# Patient Record
Sex: Female | Born: 1935 | Race: White | Hispanic: No | Smoking: Former smoker
Health system: Southern US, Community
[De-identification: ages and names within clinical notes are randomized; demographics above are authoritative.]

## PROBLEM LIST (undated history)

## (undated) DIAGNOSIS — E119 Type 2 diabetes mellitus without complications: Secondary | ICD-10-CM

## (undated) DIAGNOSIS — E785 Hyperlipidemia, unspecified: Secondary | ICD-10-CM

## (undated) DIAGNOSIS — D649 Anemia, unspecified: Secondary | ICD-10-CM

## (undated) DIAGNOSIS — I10 Essential (primary) hypertension: Secondary | ICD-10-CM

## (undated) HISTORY — DX: Hyperlipidemia, unspecified: E78.5

## (undated) HISTORY — DX: Anemia, unspecified: D64.9

## (undated) HISTORY — DX: Type 2 diabetes mellitus without complications: E11.9

## (undated) HISTORY — DX: Essential (primary) hypertension: I10

---

## 2000-03-18 ENCOUNTER — Inpatient Hospital Stay
Admission: EM | Admit: 2000-03-18 | Disposition: A | Discharge status: Home / Self Care | Payer: Self-pay | Source: Emergency Department | Admitting: Internal Medicine

## 2000-04-22 ENCOUNTER — Inpatient Hospital Stay
Admission: RE | Admit: 2000-04-22 | Disposition: A | Discharge status: Home / Self Care | Payer: Self-pay | Source: Ambulatory Visit | Admitting: Obstetrics & Gynecology

## 2008-09-29 ENCOUNTER — Ambulatory Visit
Admit: 2008-09-29 | Disposition: A | Discharge status: Home / Self Care | Payer: Self-pay | Source: Ambulatory Visit | Admitting: Internal Medicine

## 2008-10-19 ENCOUNTER — Ambulatory Visit
Admit: 2008-10-19 | Disposition: A | Discharge status: Home / Self Care | Payer: Self-pay | Source: Ambulatory Visit | Admitting: Surgery

## 2008-10-19 LAB — CBC AND DIFFERENTIAL
Basophils Absolute: 0 /mm3 (ref 0.0–0.2)
Basophils: 0 % (ref 0–2)
Eosinophils Absolute: 0.1 /mm3 (ref 0.0–0.7)
Eosinophils: 1 % (ref 0–5)
Granulocytes Absolute: 3.7 /mm3 (ref 1.8–8.1)
Hematocrit: 43.4 % (ref 37.0–47.0)
Hgb: 14 G/DL (ref 12.0–16.0)
Immature Granulocytes Absolute: 0
Immature Granulocytes: 0 %
Lymphocytes Absolute: 1.5 /mm3 (ref 0.5–4.4)
Lymphocytes: 26 % (ref 15–41)
MCH: 29 PG (ref 28.0–32.0)
MCHC: 32.3 G/DL (ref 32.0–36.0)
MCV: 90 FL (ref 80.0–100.0)
MPV: 12.1 FL (ref 9.4–12.3)
Monocytes Absolute: 0.6 /mm3 (ref 0.0–1.2)
Monocytes: 10 % (ref 0–11)
Neutrophils %: 62 % (ref 52–75)
Platelets: 200 /mm3 (ref 140–400)
RBC: 4.82 /mm3 (ref 4.20–5.40)
RDW: 13.5 % (ref 11.5–15.0)
WBC: 5.86 /mm3 (ref 3.50–10.80)

## 2008-10-19 LAB — BASIC METABOLIC PANEL
BUN: 21 mg/dL — ABNORMAL HIGH (ref 8–20)
CO2: 35 mEq/L — ABNORMAL HIGH (ref 21–30)
Calcium: 10 mg/dL (ref 8.6–10.2)
Chloride: 100 mEq/L (ref 98–107)
Creatinine: 0.9 mg/dL (ref 0.6–1.5)
Glucose: 183 mg/dL — ABNORMAL HIGH (ref 70–100)
Potassium: 4.1 mEq/L (ref 3.6–5.0)
Sodium: 143 mEq/L (ref 136–146)

## 2008-10-19 LAB — GFR

## 2008-11-01 ENCOUNTER — Ambulatory Visit
Admit: 2008-11-01 | Disposition: A | Discharge status: Home / Self Care | Payer: Self-pay | Source: Ambulatory Visit | Admitting: Radiation Oncology

## 2008-11-07 ENCOUNTER — Ambulatory Visit: Admission: RE | Admit: 2008-11-07 | Payer: Self-pay | Source: Ambulatory Visit | Admitting: Surgery

## 2009-06-02 DIAGNOSIS — C50919 Malignant neoplasm of unspecified site of unspecified female breast: Secondary | ICD-10-CM

## 2009-06-02 HISTORY — DX: Malignant neoplasm of unspecified site of unspecified female breast: C50.919

## 2009-07-09 ENCOUNTER — Ambulatory Visit
Admit: 2009-07-09 | Disposition: A | Discharge status: Home / Self Care | Payer: Self-pay | Source: Ambulatory Visit | Admitting: Internal Medicine

## 2010-02-24 ENCOUNTER — Ambulatory Visit
Admit: 2010-02-24 | Disposition: A | Discharge status: Home / Self Care | Payer: Self-pay | Source: Ambulatory Visit | Admitting: Internal Medicine

## 2010-03-12 ENCOUNTER — Ambulatory Visit
Admit: 2010-03-12 | Disposition: A | Discharge status: Home / Self Care | Payer: Self-pay | Source: Ambulatory Visit | Admitting: Orthopaedic Surgery

## 2010-04-29 ENCOUNTER — Ambulatory Visit: Payer: Self-pay

## 2010-04-29 ENCOUNTER — Inpatient Hospital Stay
Admission: RE | Admit: 2010-04-29 | Disposition: A | Discharge status: Home Health | Payer: Self-pay | Source: Ambulatory Visit | Admitting: Orthopaedic Surgery

## 2010-04-30 LAB — BASIC METABOLIC PANEL
Anion Gap: 6 (ref 5.0–15.0)
BUN: 22 mg/dL — ABNORMAL HIGH (ref 7–21)
CO2: 29 mEq/L (ref 22–31)
Calcium: 8.4 mg/dL — ABNORMAL LOW (ref 8.6–10.2)
Chloride: 104 mEq/L (ref 98–107)
Creatinine: 0.7 mg/dL (ref 0.5–1.4)
Glucose: 113 mg/dL — ABNORMAL HIGH (ref 70–100)
Potassium: 4 mEq/L (ref 3.6–5.0)
Sodium: 139 mEq/L (ref 136–143)

## 2010-04-30 LAB — CBC AND DIFFERENTIAL
Baso(Absolute): 0 10*3/uL (ref 0.00–0.20)
Basophils: 0 % (ref 0–2)
Eosinophils Absolute: 0 10*3/uL (ref 0.00–0.70)
Eosinophils: 0 % (ref 0–5)
Hematocrit: 31.5 % — ABNORMAL LOW (ref 37.0–47.0)
Hgb: 10.4 g/dL — ABNORMAL LOW (ref 12.0–16.0)
Immature Granulocytes Absolute: 0.03 10*3/uL
Immature Granulocytes: 0 % (ref 0–1)
Lymphocytes Absolute: 0.73 10*3/uL (ref 0.50–4.40)
Lymphocytes: 9 % — ABNORMAL LOW (ref 15–41)
MCH: 30.5 pg (ref 28.0–32.0)
MCHC: 33 g/dL (ref 32.0–36.0)
MCV: 92.4 fL (ref 80.0–100.0)
MPV: 11.2 fL (ref 9.4–12.3)
Monocytes Absolute: 0.98 10*3/uL (ref 0.00–1.20)
Monocytes: 12 % — ABNORMAL HIGH (ref 0–11)
Neutrophils Absolute: 6.34 10*3/uL
Neutrophils: 79 % — ABNORMAL HIGH (ref 52–75)
Platelets: 146 10*3/uL (ref 140–400)
RBC: 3.41 10*6/uL — ABNORMAL LOW (ref 4.20–5.40)
RDW: 14 % (ref 12–15)
WBC: 8.05 10*3/uL (ref 3.50–10.80)

## 2010-04-30 LAB — GFR: EGFR: >60

## 2010-05-01 LAB — LAB USE ONLY - HISTORICAL SURGICAL PATHOLOGY

## 2010-05-01 LAB — CBC AND DIFFERENTIAL
Baso(Absolute): 0.01 10*3/uL (ref 0.00–0.20)
Basophils: 0 % (ref 0–2)
Eosinophils Absolute: 0.03 10*3/uL (ref 0.00–0.70)
Eosinophils: 0 % (ref 0–5)
Hematocrit: 29.7 % — ABNORMAL LOW (ref 37.0–47.0)
Hgb: 9.7 g/dL — ABNORMAL LOW (ref 12.0–16.0)
Immature Granulocytes Absolute: 0.04 10*3/uL
Immature Granulocytes: 1 % (ref 0–1)
Lymphocytes Absolute: 0.71 10*3/uL (ref 0.50–4.40)
Lymphocytes: 8 % — ABNORMAL LOW (ref 15–41)
MCH: 30.1 pg (ref 28.0–32.0)
MCHC: 32.7 g/dL (ref 32.0–36.0)
MCV: 92.2 fL (ref 80.0–100.0)
MPV: 11.3 fL (ref 9.4–12.3)
Monocytes Absolute: 1.03 10*3/uL (ref 0.00–1.20)
Monocytes: 12 % — ABNORMAL HIGH (ref 0–11)
Neutrophils Absolute: 6.89 10*3/uL
Neutrophils: 80 % — ABNORMAL HIGH (ref 52–75)
Platelets: 123 10*3/uL — ABNORMAL LOW (ref 140–400)
RBC: 3.22 10*6/uL — ABNORMAL LOW (ref 4.20–5.40)
RDW: 14 % (ref 12–15)
WBC: 8.67 10*3/uL (ref 3.50–10.80)

## 2010-05-01 LAB — BASIC METABOLIC PANEL
Anion Gap: 4 — ABNORMAL LOW (ref 5.0–15.0)
BUN: 19 mg/dL (ref 7–21)
CO2: 29 mEq/L (ref 22–31)
Calcium: 9.2 mg/dL (ref 8.6–10.2)
Chloride: 103 mEq/L (ref 98–107)
Creatinine: 0.7 mg/dL (ref 0.5–1.4)
Glucose: 162 mg/dL — ABNORMAL HIGH (ref 70–100)
Potassium: 3.7 mEq/L (ref 3.6–5.0)
Sodium: 136 mEq/L (ref 136–143)

## 2010-05-01 LAB — GFR: EGFR: >60

## 2010-10-10 ENCOUNTER — Ambulatory Visit
Admission: RE | Admit: 2010-10-10 | Discharge: 2010-10-10 | Payer: Self-pay | Source: Ambulatory Visit | Attending: Gastroenterology | Admitting: Gastroenterology

## 2011-02-23 LAB — ECG 12-LEAD
Atrial Rate: 55 {beats}/min
P Axis: 48 degrees
P-R Interval: 140 ms
Q-T Interval: 490 ms
QRS Duration: 144 ms
QTC Calculation (Bezet): 468 ms
R Axis: -37 degrees
T Axis: 116 degrees
Ventricular Rate: 55 {beats}/min

## 2011-03-21 NOTE — Op Note (Signed)
Margaret Gillespie, Margaret Gillespie      MRN:          [id number]      Account:      [id number]      Document ID:  [id number]      Procedure Date: 04/29/2010            Admit Date: 04/29/2010            Patient Location: [facility]      Patient Type: I            SURGEON: Cherlynn Polo MD      ASSISTANT:                  PREOPERATIVE DIAGNOSIS:      Left hip arthritis.            POSTOPERATIVE DIAGNOSIS:      Left hip arthritis.            TITLE OF PROCEDURE:      Left total hip replacement.            COMPONENTS:      Biomet #12 biometric stem, 52 mm cup, 36 mm head, +3 neck length, +3 3 high      wall size 23 liner.            ANESTHESIA:      General.            INDICATIONS:      The patient is an 77 year old with hip pain with radiographic evidence of      degenerative joint disease.  She understands risks of surgery including      infection, nerve injury, fracture, dislocation, leg length discrepancy,      neurovascular injury, blood clots, and continued discomfort, and has      elected to proceed.            FINDINGS:      Severe arthritis in the hip with significant synovitis.            NEED FOR ASSISTANT:      My assistant provided retraction and positioning during the entirety of the      operation.            DESCRIPTION OF PROCEDURE:      The patient was brought to the operating room.  After general anesthesia      was obtained and antibiotics were given, a Foley catheter was placed.  The      patient was then placed in lateral decubitus position, padded, and secured      to the bed appropriately.  The patient was then prepped and draped in a      sterile fashion.  Standard anterolateral skin incision was made over top of                                   Page 1 of 2      Margaret Gillespie, Margaret Gillespie      MRN:          [id number]      Account:      [id number]      Document ID:  [id number]      Procedure Date: 04/29/2010            the lateral aspect of the hip.  The incision was taken down through the      subcutaneous  fat  with electrocautery for hemostasis.  The fascia was then      split proximally and distally, exposing the gluteus medius.  The anterior      one-third of the gluteus medius was reflected off the greater trochanter      and the hip capsule was then exposed.  The hip capsule was sizers with      significant amount of synovitis and some of the synovial fluid.  The hip      was then dislocated and an osteotomy was made approximately 1 cm above tip      of the trochanter with an oscillating saw.  Retraction was then placed and      the remaining of the labrum was removed as well as the fovea with      electrocautery.  Sequential reaming went from size 43 to size 51.  A 52-mm      trial fit nicely.  A 52 mm RingLoc cup was then impacted into place and a      +3 high wall liner size 23 was then impacted into place.  At this point,      the hip was placed into the sterile bag anteriorly and a femoral neck      retractor was placed.  A cookie cutter osteotome was used to remove the      lateral cortex of the femoral neck and the sequential canal finder was used      and then a lateralizing reamer.  Sequential reaming went from size 7 to      size 12.  Sequential broaching went to size 12.  A calcar planer was then      used.  The 12-mm stem with vigorous irrigation was performed.  A 12-mm stem      was impacted into place.  A trial with a standard neck length showed some      laxity.  A +3 showed nice fit with good range of motion.  Vigorous      irrigation was then performed.  A 36-mm head with a +3 neck was then      impacted into place and the hip was reduced, taken through a range of      motion, showing good range of motion and good stability.  Vigorous      irrigation was performed.  The gluteus medius was reapproximated to the      greater trochanter using 3 drill holes and #2 Vicryl.  This was then      oversewn with #2 Vicryl.  Fascia was closed with #2 Vicryl.  Subcutaneous      layer was closed with 2-0 Vicryl.   The skin was closed with 3-0 Monocryl.      Steri-Strips were placed.  A sterile dressing was placed.  The patient was      taken to the recovery room in stable condition.                        Electronic Signing Provider            D:  04/29/2010 09:06 AM by Dr. Gerrit Friends. Sissy Hoff, MD ([id number])      T:  04/29/2010 09:59 AM by [id number]                  cc:  Page 2 of 2      Authenticated by Gerrit Friends. Princesa Willig, MD ([id number]) On 04/29/2010 11:34:39 AM

## 2011-03-21 NOTE — Op Note (Unsigned)
Account Number: [id number]      Document ID: [id number]      Admit Date: 11/07/2008      Procedure Date: 11/07/2008            Patient Location: DISCHARGED 11/07/2008      Patient Type: A            SURGEON: Verneda Skill MD      ASSISTANT:                  PREOPERATIVE DIAGNOSIS:      Right breast cancer.            POSTOPERATIVE DIAGNOSIS:      Right breast cancer.            TITLE OF PROCEDURE:      1.  Isosulfan blue dye injection.      2.  Right sentinel lymph node biopsy with a gamma probe.      3.  Right lumpectomy with mammographic needle localization.            ANESTHESIA:      General.            INDICATIONS:      This is a 77 year old female recently diagnosed with invasive ductal      carcinoma on the right breast at 11 o'clock in position.  Preoperative MRI      of the breast showing unifocal disease.  The treatment options were      discussed and patient wished to proceed with breast conservation.  Right      sentinel lymph node biopsy for lymph node status assessment and the      lumpectomy to clear the margin were recommended and discussed and informed      consent was obtained prior to the procedure.            DESCRIPTION OF PROCEDURE:      The patient was placed on the operating room table in supine position after      guidewire placement and injection of the radioactive dye was done.  Then      after adequate general anesthesia with LMA was induced, 1 mL of isosulfan      blue dye was injected in the subareolar complex of the right breast      corresponding to the tumor location.  Then, right breast was lightly      agitated for approximately 5 minutes.  Then right breast and axilla were      prepped and draped in usual sterile fashion.  Then, using the gamma probe,      the area of the highest radioactivity in the right axilla was identified      and a curvilinear incision in the skin crease directly over this spot was      made and the clavipectoral fascia was incised and the right axilla was       explored.  A single blue-stained lymphatics led to enlarged, however, soft,      benign-appearing lymph nodes in the right axilla which was dissected and      removed after the lymphatic channels were tied with 3-0 Vicryl ties.  The      radioactivity on the removed lymph node was over 25,000.  After the      removal, the right axilla was briefly explored and found to have no other      enlarged or suspicious lymph nodes and the background count of the  radioactivity was less than 10% of the targeted initial count.  The lymph      node was then examined by the pathologist and reported to be negative for      metastatic tumor.  Adequate hemostasis from the right axilla was observed      and the incision was closed with absorbable suture in multiple layers.            Then attention was turned to the right breast where a curvilinear incision      in the upper outer quadrant of the right breast just beyond the areolar      line was made and the subcutaneous flap was raised and the guidewire was      dissected and delivered through the wound.  Making reference to the      guidewire, the generous lumpectomy specimen measuring approximately 4-cm in      diameter was removed.  The specimen was oriented with a silk suture in the      usual fashion and was sent to x-ray.  Radiograph confirms the presence of      the biopsy clip.  Then, it was sent to pathology.            Then, adequate hemostasis from the lumpectomy cavity was obtained and small      metal clips were placed in the lumpectomy cavity.  Then the incision was      infiltrated with local anesthetic and closed with absorbable suture in      multiple layers.  The patient tolerated the procedure well and was      transferred to recovery room in satisfactory condition.  Estimated blood      loss was minimum and the counts were correct at the end of the procedure.                              _______________________________     Date/Time Signed: _____________       Verneda Skill MD [id number])            D:  11/07/2008 17:59 PM by Dr. Verneda Skill, MD [id number])      T:  11/08/2008 08:23 AM by [id number]          Everlean Cherry: [id number]) (Doc ID: [id number])                  MV:[id number] Anderson M.D.      Curt Bears MD      Miguel Dibble MD      Verneda Skill MD

## 2011-05-20 NOTE — Op Note (Signed)
Introduction: [id number] Document ID: [id number] -- 77 year old female      patient presents for an outpatient Colonoscopy on 10/10/2010.            Indications: Screening.            Consent: The benefits, risks, and alternatives to the procedure were      discussed and informed consent was obtained from the patient.            Preparation: EKG, pulse, pulse oximetry, and blood pressure were monitored      throughout the procedure.            Medications: IV administered.            Rectal Exam: Normal rectal exam.            Procedure: The colonoscope was passed through the anus under direct      visualization and was advanced with ease to the cecum, confirmed by      landmarks. The scope was withdrawn and the mucosa was carefully examined.      The quality of the preparation was excellent. The views were excellent.      The patient's toleration of the procedure was excellent. Retroflexion was      performed in the rectum. Withdrawal time > 8 min with careful inspection      of proximal and distal aspects of each fold.            Estimated Blood Loss: Negligible.            Findings: There was evidence of moderately severe diverticulosis in the      ascending colon. There was evidence of mild diverticulosis in the sigmoid      colon. Medium-sized internal hemorrhoids were found. Otherwise, the colon      appeared to be normal.            Unplanned Events: There were no unplanned events.            Summary: Moderately severe diverticulosis (562.10) found in the ascending      colon. Mild diverticulosis (562.10) found in the sigmoid colon. Internal      hemorrhoids found (455.0).            Recommendations: Diverticulosis diet, fiber supplementation, and potential      complications as discussed. Patient instructed to repeat colonoscopy in      7-10 y. Follow up as needed. Follow up with Dr. Dareen Piano as directed.            Procedure Codes: [45378]Colonoscopy            Performed By: The procedure was performed by  Halina Andreas, MD.      Version 1, electronically signed by Dr. Marge Duncans on 10/10/2010 at 15:37.      D:/[email or web address]

## 2012-05-24 NOTE — Discharge Summary (Unsigned)
Margaret Gillespie, TISSUE                      #[id number]            ADMITTED:  04/22/2000               DISCHARGED:  04/23/2000            ATTENDING PHYSICIAN:                Wyvonna Plum, MD            ADMITTING DIAGNOSES:      1.    Symptomatic fibroid uterus.      2.    Metrorrhagia.      3.    Mild uterine descensus.      4.    Hypertension.            HISTORY  OF  PRESENT  ILLNESS:   The  patient  is  a  78 year old  G3,  P3,      menopausal for approximately 20 years  who  presented  with  a  history  of      abnormal uterine bleeding since April  2001.   Endometrial biopsy performed      in office on two occasions noted to be  benign.  Additionally patient had a      sonogram that revealed an adnexal mass and the patient had a normal CA-125.                  PAST MEDICAL HISTORY:  Past medical history  significant  for  hypertension      for which the patient was started on Norvasc and Atacand as well as a daily      aspirin.  Past surgical history significant  for  removal of gallbladder in      1983.            SOCIAL HISTORY:  Patient denies cigarette  smoking or I.V. drug abuse.  She      reports a history of occasional alcohol use.  Patient is widowed.            ALLERGIES: No known drug allergies.            PHYSICAL EXAMINATION:  Physical examination  at  the  time  of admission as      previously stated on the admission history.  Please see that for details.            HOSPITAL COURSE:  The patient was admitted  to  the  hospital  on 04/22/00,      underwent  surgical procedure that included  total  abdominal  hysterectomy      with a bilateral salpingo-oophorectomy.  Additionally McCall's culdoplasty.      Please see operative reports for details.   The  patient  recovered rapidly      and thus was transferred to the postoperative  floor.  The patient remained      hemodynamically stable and without fever.  On the initial postoperative day      patient was noted to be active and ambulatory,  tolerating  a   regular diet      and patient's postoperative blood count  was  noted  to be 37 for which the      patient had remained hemodynamically  stable  and  the patient had a normal  white count.  Given the patient's excellent  appearance  and  normal  vital      signs and a strong desire to go home, the patient was discharged to home.            Discharge instructions given to patient at the time of discharge:      1.    No heavy lifting or driving in the immediate postoperative period.      2.    Percocet 1-2 tabs to be taken every four hours as needed for pain.      3.    Patient instructed to call for  significant  vaginal bleeding, severe            abdominal pain, back pain, dysuria  or fever greater than or equal to            100.4.   The  patient  had previously  been  given  prescription  for            hormone replacement for which she would begin at home.            DISCHARGE DIAGNOSES:      1.    Symptomatic fibroid uterus.      2.    Metrorrhagia.      3.    Mild uterine descensus.      4.    Hypertension.            OPERATIONS PERFORMED:      1.    Total abdominal hysterectomy.      2.    Bilateral salpingo-oophorectomy.      3.    McCall's culdoplasty.                                                              _____________________________________                                            _____                                            Wyvonna Plum, MD      T A/mdislk      D: 05/29/2000 3:11 P      T: 06/01/2000 11:17 P      J: [id number]      N: [id number]      CC: Wyvonna Plum, MD

## 2013-11-29 ENCOUNTER — Encounter: Payer: Self-pay | Admitting: Obstetrics & Gynecology

## 2015-11-05 ENCOUNTER — Ambulatory Visit: Payer: Medicare Other | Attending: Gastroenterology

## 2015-11-05 NOTE — Pre-Procedure Instructions (Signed)
No orders to note.No Pharmacy orders.No testing per anesthesia guidelines.

## 2015-11-15 ENCOUNTER — Encounter
Admission: RE | Disposition: A | Discharge status: Home / Self Care | Payer: Self-pay | Source: Ambulatory Visit | Attending: Gastroenterology

## 2015-11-15 ENCOUNTER — Ambulatory Visit: Payer: Medicare Other | Admitting: Gastroenterology

## 2015-11-15 ENCOUNTER — Ambulatory Visit: Payer: Medicare Other | Admitting: Acute Care

## 2015-11-15 ENCOUNTER — Ambulatory Visit: Payer: Self-pay

## 2015-11-15 ENCOUNTER — Ambulatory Visit
Admission: RE | Admit: 2015-11-15 | Discharge: 2015-11-15 | Disposition: A | Discharge status: Home / Self Care | Payer: Medicare Other | Source: Ambulatory Visit | Attending: Gastroenterology | Admitting: Gastroenterology

## 2015-11-15 DIAGNOSIS — D120 Benign neoplasm of cecum: Secondary | ICD-10-CM | POA: Insufficient documentation

## 2015-11-15 DIAGNOSIS — K5289 Other specified noninfective gastroenteritis and colitis: Secondary | ICD-10-CM | POA: Insufficient documentation

## 2015-11-15 DIAGNOSIS — K2950 Unspecified chronic gastritis without bleeding: Secondary | ICD-10-CM | POA: Insufficient documentation

## 2015-11-15 DIAGNOSIS — E119 Type 2 diabetes mellitus without complications: Secondary | ICD-10-CM | POA: Insufficient documentation

## 2015-11-15 DIAGNOSIS — K635 Polyp of colon: Secondary | ICD-10-CM

## 2015-11-15 DIAGNOSIS — K259 Gastric ulcer, unspecified as acute or chronic, without hemorrhage or perforation: Secondary | ICD-10-CM | POA: Insufficient documentation

## 2015-11-15 DIAGNOSIS — I10 Essential (primary) hypertension: Secondary | ICD-10-CM | POA: Insufficient documentation

## 2015-11-15 DIAGNOSIS — D509 Iron deficiency anemia, unspecified: Secondary | ICD-10-CM | POA: Insufficient documentation

## 2015-11-15 DIAGNOSIS — K648 Other hemorrhoids: Secondary | ICD-10-CM | POA: Insufficient documentation

## 2015-11-15 DIAGNOSIS — E785 Hyperlipidemia, unspecified: Secondary | ICD-10-CM | POA: Insufficient documentation

## 2015-11-15 DIAGNOSIS — K551 Chronic vascular disorders of intestine: Secondary | ICD-10-CM | POA: Insufficient documentation

## 2015-11-15 DIAGNOSIS — K5730 Diverticulosis of large intestine without perforation or abscess without bleeding: Secondary | ICD-10-CM | POA: Insufficient documentation

## 2015-11-15 DIAGNOSIS — D124 Benign neoplasm of descending colon: Secondary | ICD-10-CM | POA: Insufficient documentation

## 2015-11-15 DIAGNOSIS — K2970 Gastritis, unspecified, without bleeding: Secondary | ICD-10-CM

## 2015-11-15 LAB — GLUCOSE WHOLE BLOOD - POCT: Whole Blood Glucose POCT: 125 mg/dL — ABNORMAL HIGH (ref 70–100)

## 2015-11-15 SURGERY — EGD, COLONOSCOPY
Anesthesia: Anesthesia General | Site: Abdomen | Wound class: Clean Contaminated

## 2015-11-15 MED ORDER — PROPOFOL 10 MG/ML IV EMUL (WRAP)
INTRAVENOUS | Status: AC
Start: 2015-11-15 — End: ?
  Filled 2015-11-15: qty 40

## 2015-11-15 MED ORDER — EPHEDRINE SULFATE 50 MG/ML IJ SOLN
INTRAMUSCULAR | Status: DC | PRN
Start: 2015-11-15 — End: 2015-11-15
  Administered 2015-11-15 (×2): 10 mg via INTRAVENOUS

## 2015-11-15 MED ORDER — LIDOCAINE HCL 2 % IJ SOLN
INTRAMUSCULAR | Status: DC | PRN
Start: 2015-11-15 — End: 2015-11-15
  Administered 2015-11-15: 80 mg

## 2015-11-15 MED ORDER — SODIUM CHLORIDE 0.9 % IJ SOLN
INTRAMUSCULAR | Status: AC
Start: 2015-11-15 — End: ?
  Filled 2015-11-15: qty 10

## 2015-11-15 MED ORDER — LIDOCAINE HCL 2 % IJ SOLN
INTRAMUSCULAR | Status: AC
Start: 2015-11-15 — End: ?
  Filled 2015-11-15: qty 20

## 2015-11-15 MED ORDER — EPHEDRINE SULFATE 50 MG/ML IJ/IV SOLN (WRAP)
Status: AC
Start: 2015-11-15 — End: ?
  Filled 2015-11-15: qty 1

## 2015-11-15 MED ORDER — PROPOFOL 10 MG/ML IV EMUL (WRAP)
INTRAVENOUS | Status: AC
Start: 2015-11-15 — End: ?
  Filled 2015-11-15: qty 20

## 2015-11-15 MED ORDER — PHENYLEPHRINE HCL 10 MG/ML IV SOLN (WRAP)
Status: AC
Start: 2015-11-15 — End: ?
  Filled 2015-11-15: qty 1

## 2015-11-15 MED ORDER — SODIUM CHLORIDE 0.9 % IV SOLN
INTRAVENOUS | Status: DC
Start: 2015-11-15 — End: 2015-11-15

## 2015-11-15 MED ORDER — LACTATED RINGERS IV SOLN
INTRAVENOUS | Status: DC
Start: 2015-11-15 — End: 2015-11-15

## 2015-11-15 MED ORDER — PHENYLEPHRINE HCL 10 MG/ML IV SOLN (WRAP)
Status: DC | PRN
Start: 2015-11-15 — End: 2015-11-15
  Administered 2015-11-15: 100 ug via INTRAVENOUS

## 2015-11-15 MED ORDER — PROPOFOL 10 MG/ML IV EMUL (WRAP)
INTRAVENOUS | Status: DC | PRN
Start: 2015-11-15 — End: 2015-11-15
  Administered 2015-11-15: 50 mg via INTRAVENOUS
  Administered 2015-11-15 (×3): 100 mg via INTRAVENOUS

## 2015-11-15 SURGICAL SUPPLY — 35 items
BLOCK BITE MAXI 60FR LF STRD STRAP SDPRT (Procedure Accessories) ×1
BLOCK BITE OD60 FR STURDY STRAP SIDEPORT (Procedure Accessories) ×1
BLOCK BITE OD60 FR STURDY STRAP SIDEPORT DENTAL RETENTION RIM MAXI (Procedure Accessories) ×1 IMPLANT
CLIP HEMOSTATIC L235 CM OD2.8 MM BRAID (Clips)
CLIP HEMOSTATIC L235 CM OD2.8 MM BRAID CATHETER ROTATION CONTROL KNOB (Clips) IMPLANT
CLIP HMST 11MM OPN RSL 360 2.8MM 235CM (Clips)
FORCEPS BIOPSY L240 CM JUMBO MICROMESH (Instrument)
FORCEPS BIOPSY L240 CM JUMBO MICROMESH TEETH STREAMLINE CATHETER (Instrument) IMPLANT
FORCEPS BIOPSY L240 CM LARGE CAPACITY (Instrument) ×1
FORCEPS BIOPSY L240 CM MICROMESH TEETH STREAMLINE CATHETER NEEDLE (Instrument) ×1 IMPLANT
FORCEPS BX SS JMB RJ 4 2.8MM 240CM STRL (Instrument)
FORCEPS BX SS LG CPC RJ 4 2.4MM 240CM (Instrument) ×1
GLOVES EXAM NITRILE ETS LG NS (Glove) ×2 IMPLANT
GOWN ISL PP PE REG LG LF FULL BCK NK TIE (Gown) ×4
GOWN ISOLATION REGULAR LARGE FULL BACK NECK TIE ELASTIC CUFF (Gown) ×2 IMPLANT
KIT UNIVERSAL IRRIGATION SOL (Kits) ×2 IMPLANT
MASK FLUID SHIELD W WRAP (Personal Protection) ×4 IMPLANT
NEEDLE CARR-LOCKE INJECT 25GX5 (Needles) IMPLANT
PAD ELECTROSRG GRND REM W CRD (Procedure Accessories) IMPLANT
SNARE ESCP MIC CPTVTR 13MM 240IN STRL (GE Lab Supplies)
SNARE SMALL HEXAGON CAPTIVATOR STIFF ENDOSCOPIC POLYPECTOMY (GE Lab Supplies) IMPLANT
SOLN LUBRICATING JELLY 4.25OZ (Irrigation Solutions) ×2 IMPLANT
SPONGE GAUZE L4 IN X W4 IN 16 PLY (Dressing) ×1
SPONGE GAUZE L4 IN X W4 IN 16 PLY MAXIMUM ABSORBENT USP TYPE VII (Dressing) ×1 IMPLANT
SPONGE GZE CTTN CRTY 4X4IN LF NS 16 PLY (Dressing) ×1
SYRINGE 50 ML GRADUATE NONPYROGENIC DEHP (Syringes, Needles) ×1
SYRINGE 50 ML GRADUATE NONPYROGENIC DEHP FREE PVC FREE BD MEDICAL (Syringes, Needles) ×1 IMPLANT
SYRINGE MED 50ML LF STRL GRAD N-PYRG (Syringes, Needles) ×1
TRAP  MUCOUS SPECIMEN 40CC (Procedure Accessories) IMPLANT
TRAP SPEC REM ETRAP 15CM LF STRL MAGNIFY (Procedure Accessories)
TRAP SPECIMEN REMOVAL L15 CM MAGNIFY (Procedure Accessories)
TRAP SPECIMEN REMOVAL L15 CM MAGNIFY WINDOW MEASUREMENT GUIDE ETRAP (Procedure Accessories) IMPLANT
WATER STERILE PLASTIC POUR BOTTLE 1000 (Irrigation Solutions) ×1
WATER STERILE PLASTIC POUR BOTTLE 1000 ML (Irrigation Solutions) ×1 IMPLANT
WATER STRL 1000ML PLS PR BTL LF (Irrigation Solutions) ×1

## 2015-11-15 NOTE — Discharge Instructions (Signed)
Endoscopy Discharge Instructions  General Instructions:  1. Following sedation, your judgement, perception, and coordination are considered impaired. Even though you may feel awake and alert, you are considered legally intoxicated. Therefore, until the next morning;   Do not Drive   Do not operate appliances or equipment that requires reaction time (e.g. stove, electrical tools, machinery)   Do not sign legal documents or be involved in important decisions.   Do not smoke if alone   Do not drink alcoholic beverages   Go directly home and rest for several hours before resuming your routine activities.   It is highly recommended to have a responsible adult stay with you for the next 24 hours    2. Tenderness, swelling or pain may occur at the IV site where you received sedation. If you experience this, apply warm soaks to the area. Notify your physician if this persists.    Instructions Specific To Procedures - Report To Physician Any Of The Following:    Upper Endoscopy, ERCP, Dilations   1. Pain in Chest   2. Nausea/vomitting   3. Fevers/Chills within 24 hours after procedure. Temp>101deg F   4. Severe and persistent abdominal pain and bloating     In Addition:   Mild throat soreness may follow this procedure. Warm salt water gargling or    lozenges of your choice will most likely relieve your discomfort or cold drinks and   popsicles.     Colon/Sigmoidoscopy/Proctoscopy   1. Severe and persistent abdominal pain/bloating which does not subside within   2-3 hours   2. Large amount of rectal bleeding (some mucosal blood streaking may occur,   especially if biopsy or polypectomy was done or if hemorrhoids are present.   3. Nausea/vomitting   4. Fevers/Chills within 24 hours after procedure. Temp>101deg F     In Addition:   If polyp has been removed, DO NOT take aspirin or aspirin containing products   (e.g. Anacin, Alka Seltzer, Bufferin, Etc.) or non-steroidal anti-inflammatory drugs   (e.g. Advil, Motrin,  etc.)  unless otherwise advised by doctor. Tylenol   or extra Strength Tylenol is permitted.    Additional Discharge Instructions    Your diet after the procedure:Nothing red to eat or drink for 24 hours. The first meal should be light. Nothing greasy, spicy, or fried. If that meal is tolerated you may resume your normal diet.     If you have questions or problems contact your MD immediately. If you need immediate attention, call your MD, 911 and/or go to nearest emergency room.

## 2015-11-15 NOTE — Anesthesia Postprocedure Evaluation (Signed)
Anesthesia Post Evaluation    Patient: Margaret Gillespie    Procedures performed: Procedure(s) with comments:  EGD, COLONOSCOPY - colon / egd  q1-unk, "pt will need pre op interview with anesthesia"    Anesthesia type: General TIVA    Patient location:Phase II PACU    Vitals: Reviewed and Stable.  See nurses notes for vital signs.      Post pain: Patient not complaining of pain, continue current therapy     Mental Status:awake    Respiratory Function: Doing well on room air    Cardiovascular: stable    Nausea/Vomiting: patient not complaining of nausea or vomiting    Hydration Status: adequate    Post assessment: no apparent anesthetic complications, no reportable events and no evidence of recall    Vernona Rieger C. Filipescu-Turner, MD

## 2015-11-15 NOTE — Anesthesia Preprocedure Evaluation (Signed)
Anesthesia Evaluation    AIRWAY    Mallampati: II    TM distance: >3 FB  Neck ROM: full  Mouth Opening:full   CARDIOVASCULAR    cardiovascular exam normal       DENTAL         PULMONARY    pulmonary exam normal     OTHER FINDINGS                      Anesthesia Plan    ASA 2     general               (Discussed health history and anesthesia plan)      intravenous induction   Detailed anesthesia plan: general IV            informed consent obtained      pertinent labs reviewed

## 2015-11-15 NOTE — H&P (Addendum)
GI PRE PROCEDURE NOTE    Proceduralist Comments:   Review of Systems and Past Medical / Surgical History performed: Yes     Indications: Iron deficiency anemia  Hgb 7.2, MCV 72, ferritin 7.  Last colonoscopy 2012: hemorrhoids, diverticulosis.    Previous Adverse Reaction to Anesthesia or Sedation (if yes, describe): No    Physical Exam / Laboratory Data (If applicable)   Airway Classification: Class II    General: Alert and cooperative  Lungs: Lungs clear to auscultation  Cardiac: RRR, normal S1S2.    Abdomen: Soft, non tender. Normal active bowel sounds  Other:     No labs drawn    American Society of Anesthesiologists (ASA) Physical Status Classification:   ASA 2 - Patient with mild systemic disease with no functional limitations    Planned Sedation:   Deep sedation with anesthesia    Attestation:   Margaret Gillespie has been reassessed immediately prior to the procedure and is an appropriate candidate for the planned sedation and procedure. The EGD and colonoscopy procedures were explained to the patient, including indication, alternatives, possible benefits, and potential risks (including but not limited to bleeding, infection, perforation, aspiration, splenic avulsion, anesthesia complications such as slowed breathing and lowered blood pressure). The patient appeared to understand and agrees to proceed.          Signed by: Kenney Houseman

## 2015-11-16 ENCOUNTER — Encounter: Payer: Self-pay | Admitting: Gastroenterology

## 2015-11-19 LAB — LAB USE ONLY - HISTORICAL SURGICAL PATHOLOGY

## 2018-01-06 ENCOUNTER — Other Ambulatory Visit: Payer: Self-pay | Admitting: Internal Medicine

## 2019-02-17 ENCOUNTER — Other Ambulatory Visit: Payer: Self-pay | Admitting: Internal Medicine

## 2023-07-24 IMAGING — MG MAMMOGRAPHY SCREENING BILATERAL 3[PERSON_NAME]
8 series · 8 of 24 positions shown · non-contrast
Comparison: Baseline reestablishment.

________________________________________________________________________________________________ 
MAMMOGRAPHY SCREENING BILATERAL 3GENNADY VANEGAS, 07/24/2023 [DATE]: 
CLINICAL INDICATION: Encounter for screening mammogram. History of right breast 
cancer with lumpectomy in 0886.
TECHNIQUE: Digital bilateral mammograms and 3-D Tomosynthesis were obtained. 
These were interpreted both primarily and with the aid of computer-aided 
detection system.  
BREAST DENSITY: (Level C) The breasts are heterogeneously dense, which may 
obscure small masses.

[L CC]
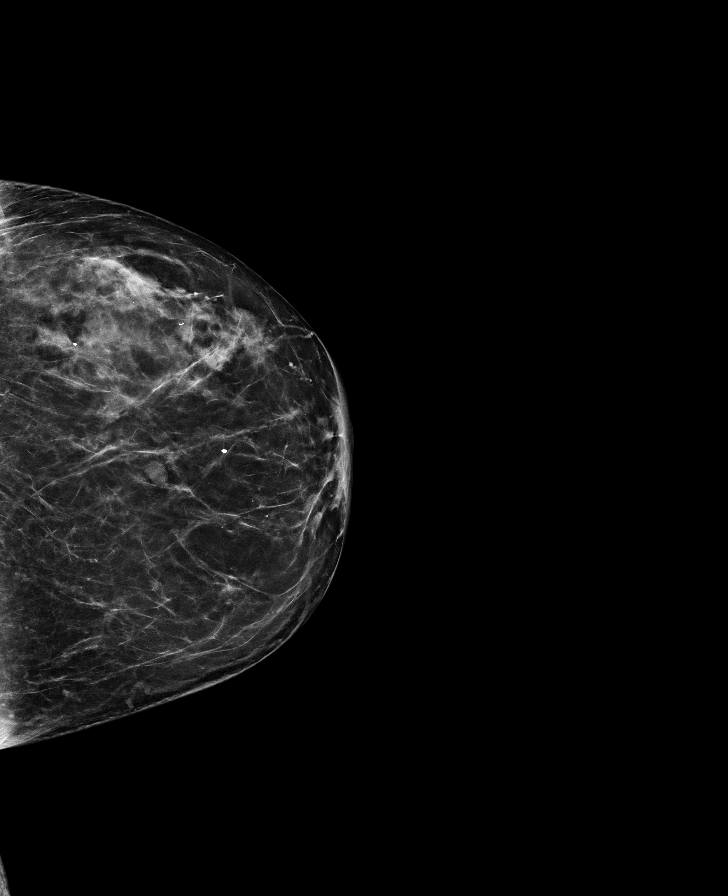

[L MLO]
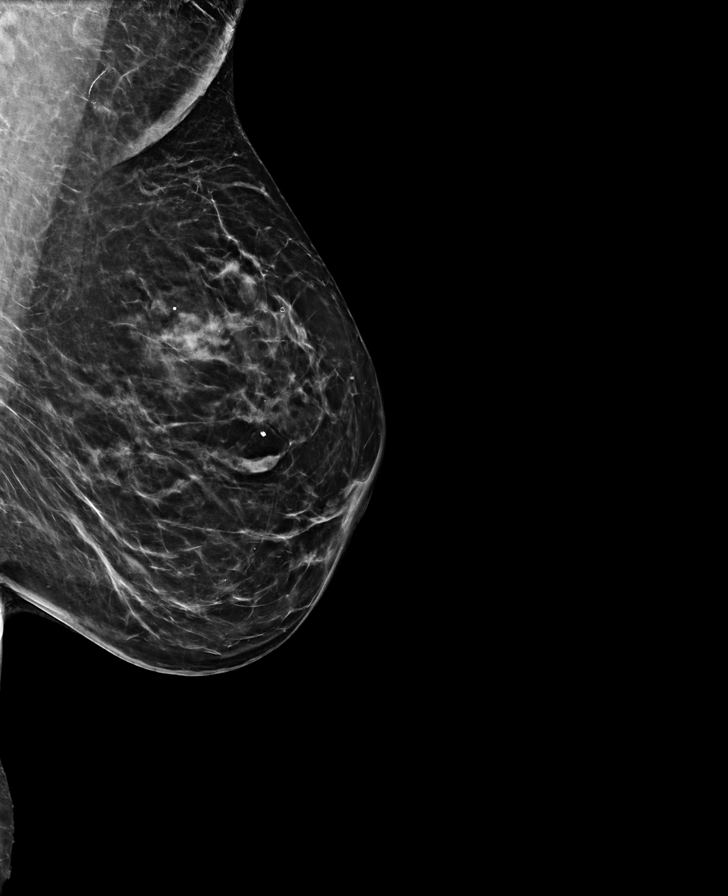

[R CC]
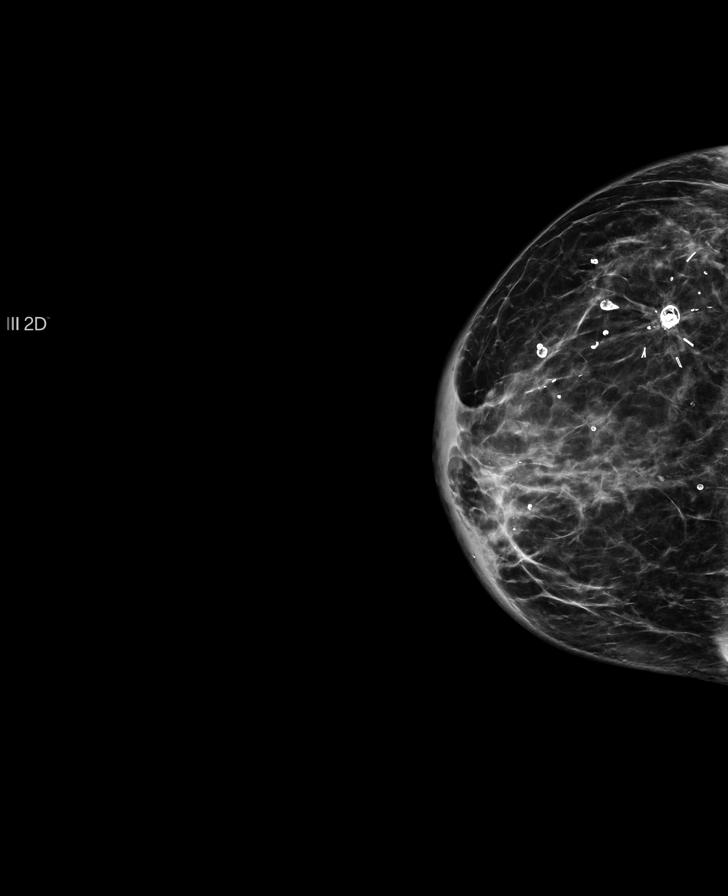

[R MLO]
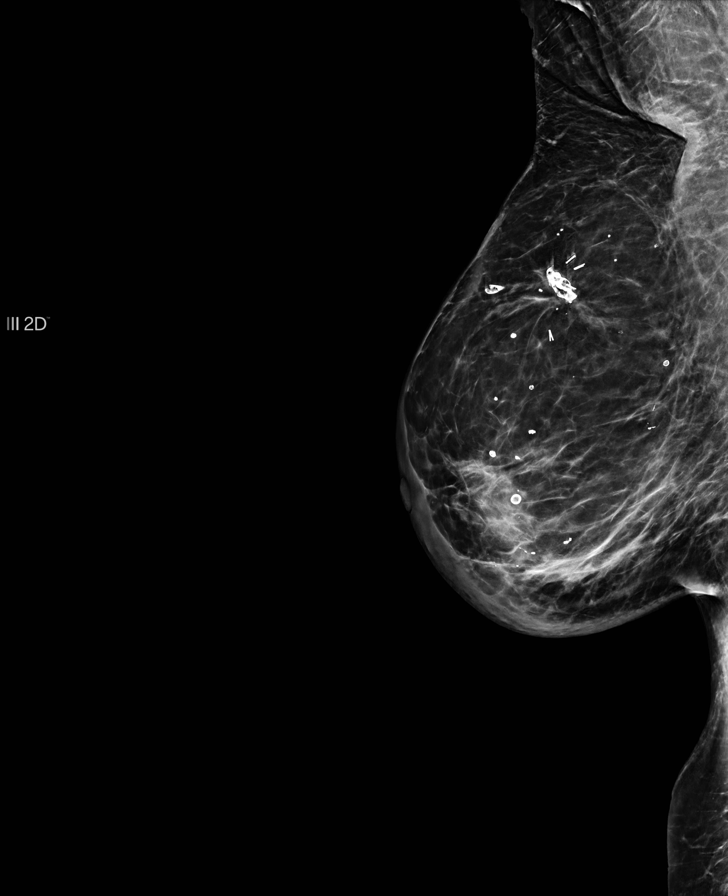

[L CC tomo · tomo slice 13/24.0]
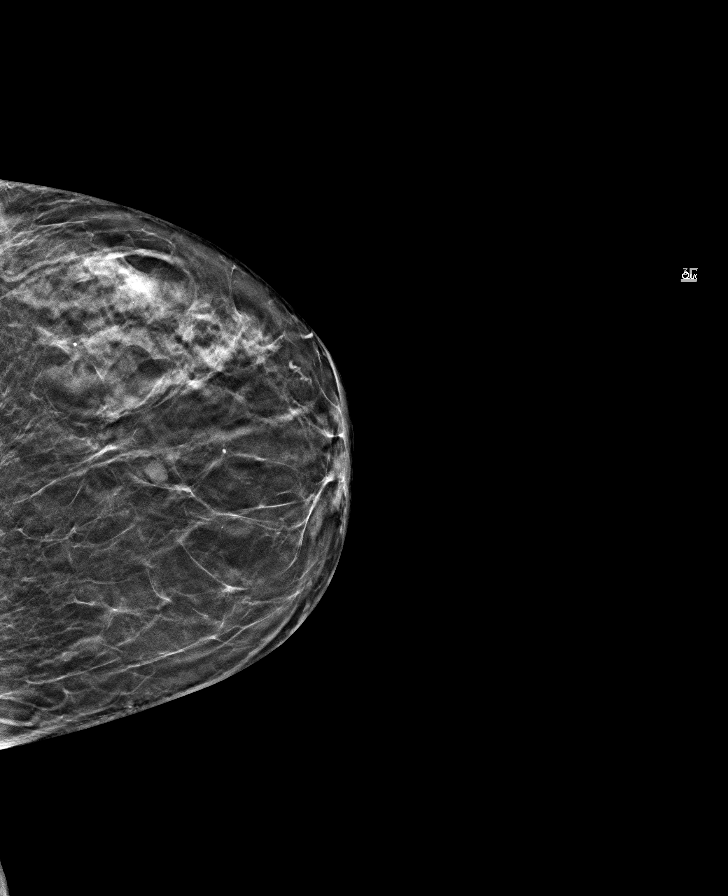

[R CC tomo · tomo slice 13/25.0]
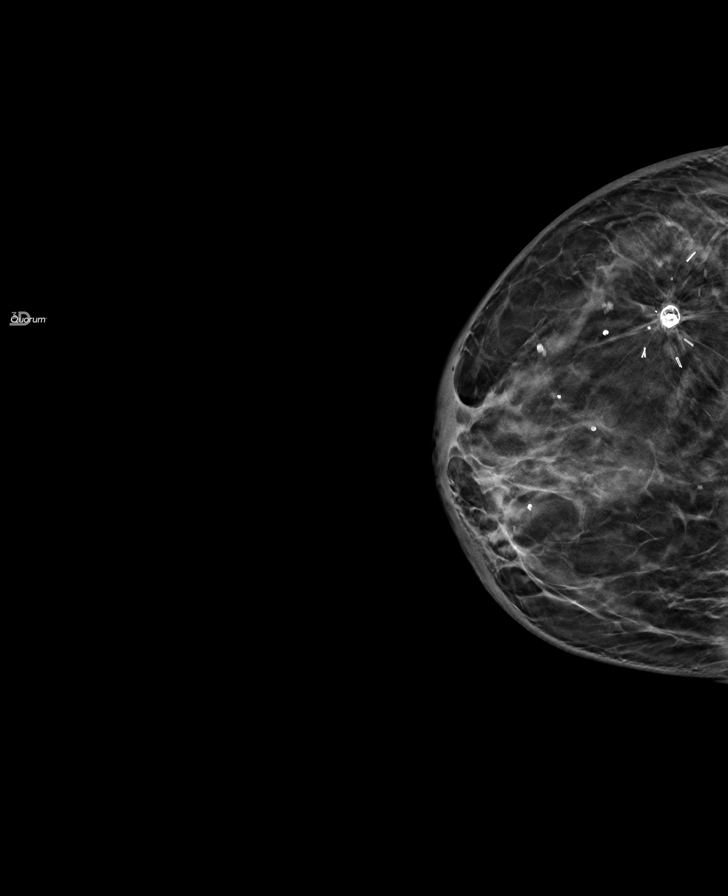

[L MLO tomo · tomo slice 13/25.0]
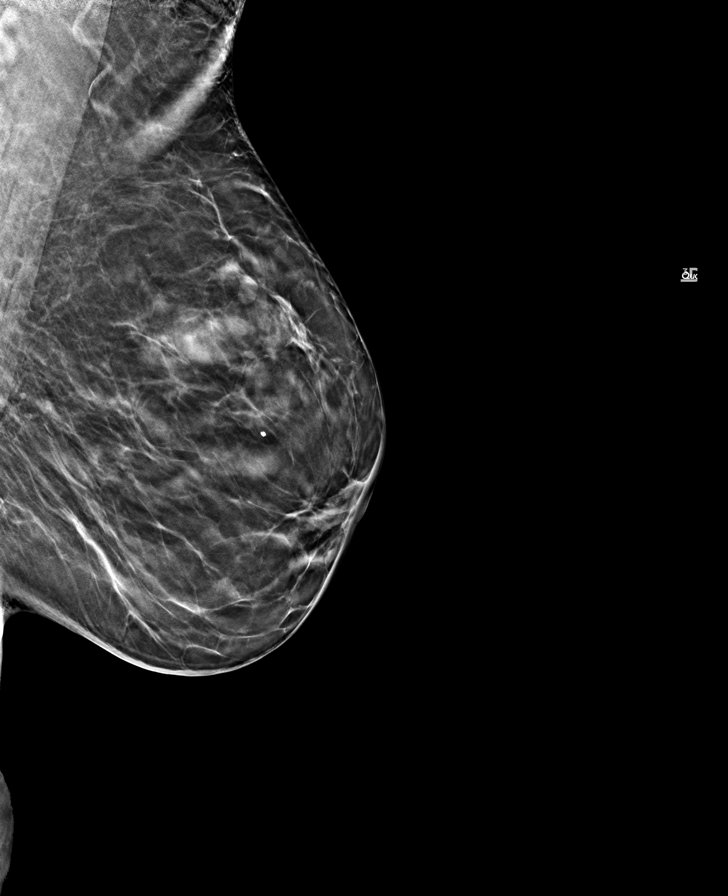

[R MLO tomo · tomo slice 15/29.0]
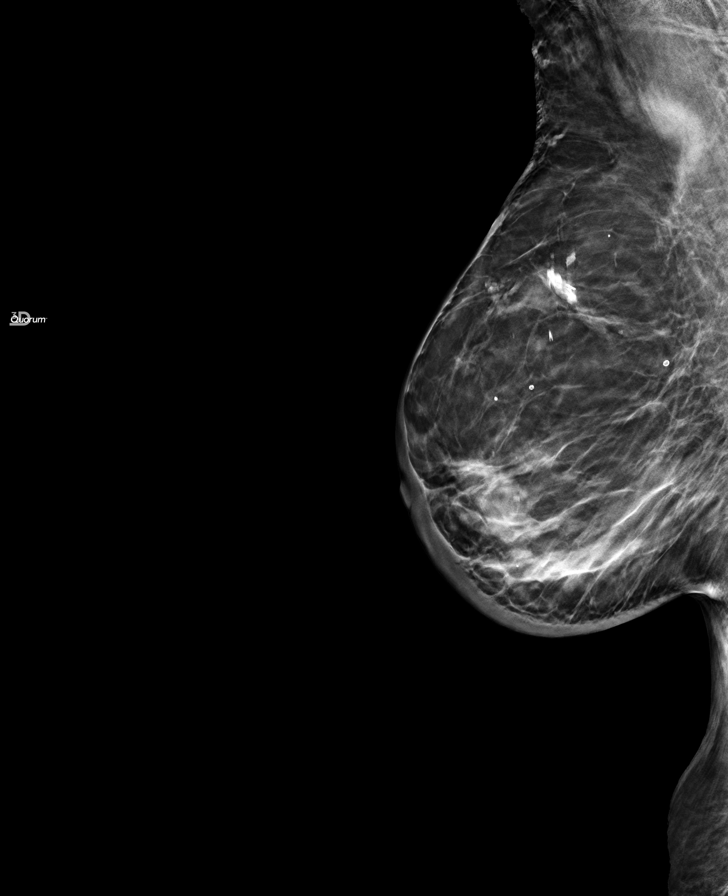

[8 of 24 positions shown; findings below may reference images not displayed]

FINDINGS: Posttreatment changes right breast. No suspicious mass, calcifications 
or area of architectural distortion within the left breast.
IMPRESSION: No mammographic findings suggestive for malignancy. 
(BI-RADS 2) Benign findings. Routine mammographic follow-up is recommended.

## 2023-10-03 IMAGING — MR MRI BRAIN W/WO CONTRAST
14 of 16 series · 39 of 48 positions shown · IV contrast (gadavist)
Comparison: Brain MRI May 03, 2023

________________________________________________________________________________________________ 
MRI BRAIN W/WO CONTRAST, 10/03/2023 [DATE]: 
CLINICAL INDICATION: Benign neoplasm cerebral meninges, history seizure April 2023, intermittent dizziness
TECHNIQUE: Multiplanar, multiecho position MR images of the brain were performed 
without and with 6.5 mL of Gadavist were injected intravenously by hand. 1 mL of 
Gadavist discarded. Patient was scanned on a 3T magnet.

[Series 101: survey · axial · 10.0mm · 0.98mm/px · 1 of 5 slices shown]
[im 1/5]
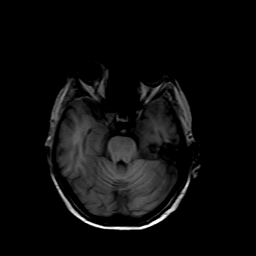

[Series 302: reg - dwi_og · axial · 4.0mm · 1.07mm/px · z∈[-61,+83]mm · 6 of 150 slices shown (1 of 2)]
[im 1/150]
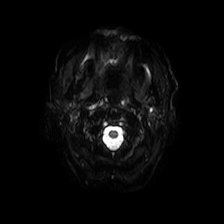
[im 30/150]
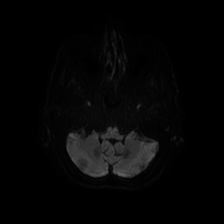
[im 60/150]
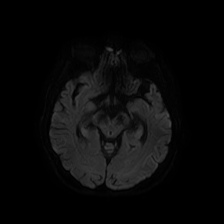
[im 90/150]
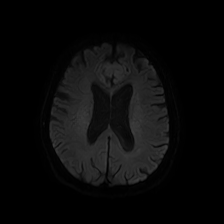
[im 120/150]
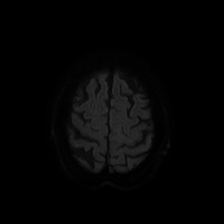
[im 150/150]
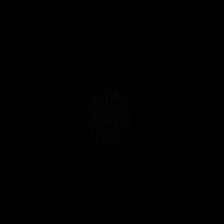

[Series 304: isoisob (id) dne · axial · 4.0mm · 1.07mm/px · 1 of 28 slices shown (1 of 2)]
[im 1/28]
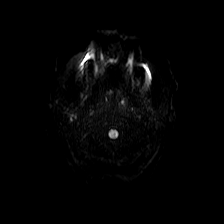

[Series 402: reg - dwi_og · coronal · 4.0mm · 0.72mm/px · 8 of 180 slices shown (2 of 2)]
[im 1/180]
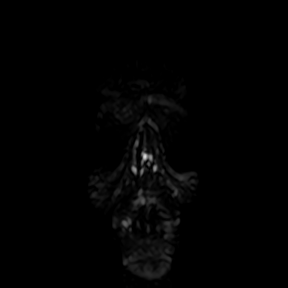
[im 26/180]
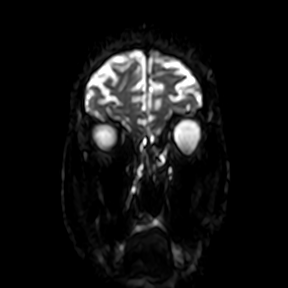
[im 52/180]
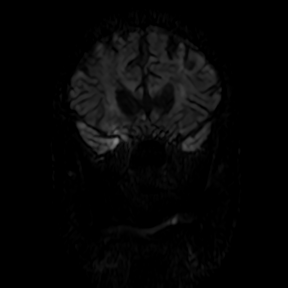
[im 77/180]
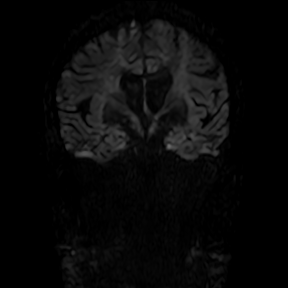
[im 103/180]
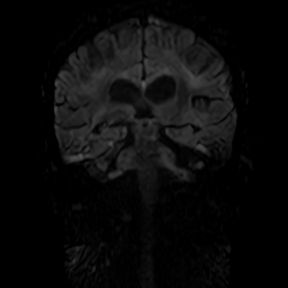
[im 128/180]
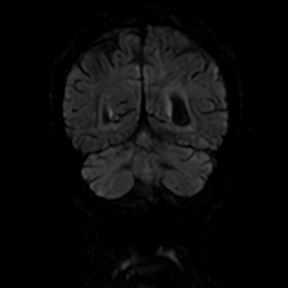
[im 154/180]
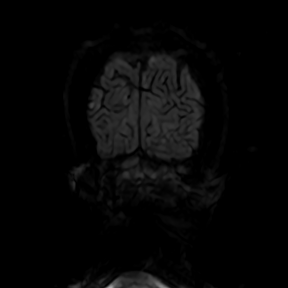
[im 180/180]
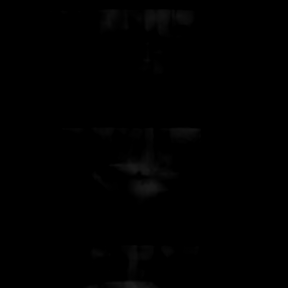

[Series 404: isoisob (id) dne · coronal · 4.0mm · 0.72mm/px · 2 of 36 slices shown (2 of 2)]
[im 1/36]
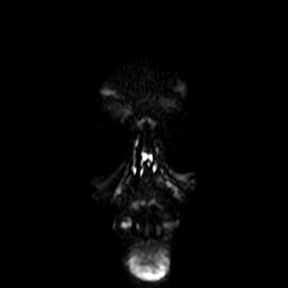
[im 36/36]
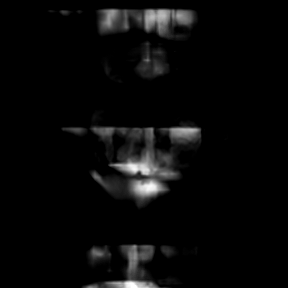

[Series 501: t1_sag dne shd · sagittal · 4.0mm · 0.44mm/px · 1 of 29 slices shown]
[im 1/29]
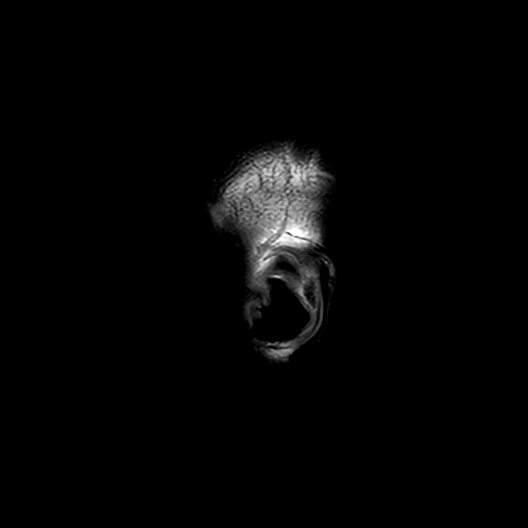

[Series 601: FLAIR fat-sat · axial · 5.0mm · 0.60mm/px · 1 of 26 slices shown]
[im 1/26]
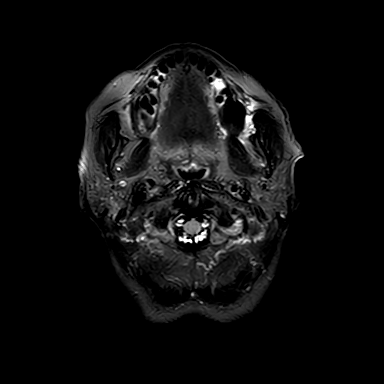

[Series 701: SWI · axial · 3.0mm · 0.53mm/px · z∈[-66,+81]mm · 4 of 100 slices shown (1 of 2)]
[im 1/100]
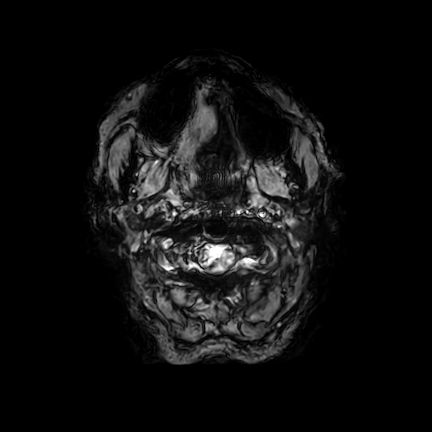
[im 34/100]
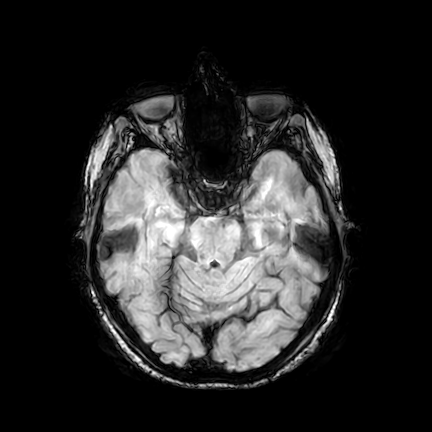
[im 67/100]
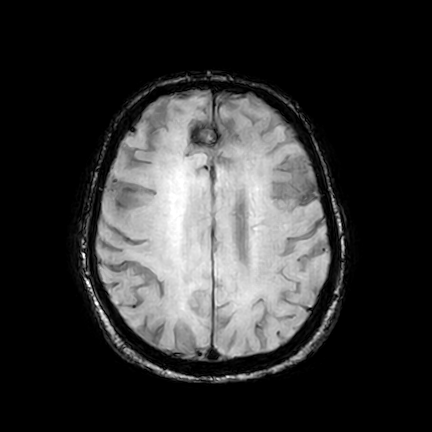
[im 100/100]
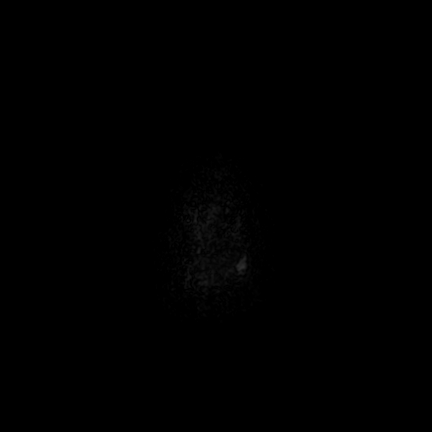

[Series 702: SWI · axial · 10.0mm · 0.53mm/px · z∈[-67,+86]mm · 3 of 78 slices shown (2 of 2)]
[im 1/78]
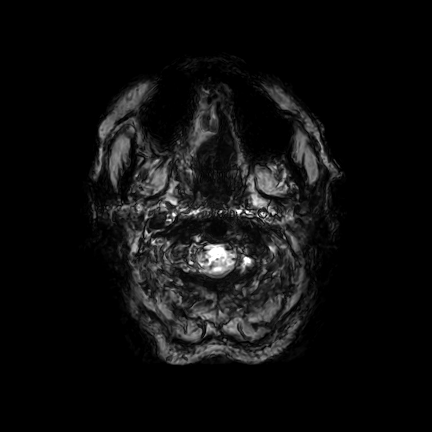
[im 39/78]
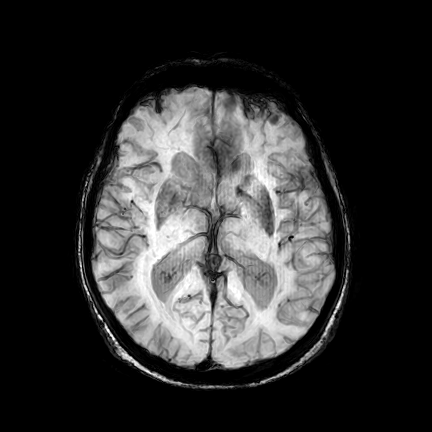
[im 78/78]
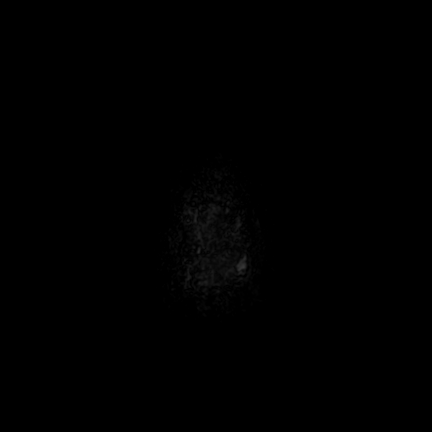

[Series 801: t2_cor dne shd · coronal · 4.0mm · 0.47mm/px · 2 of 36 slices shown]
[im 1/36]
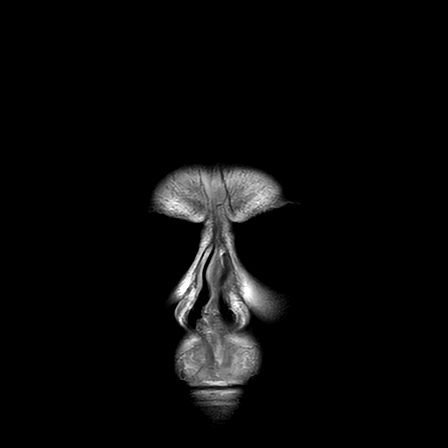
[im 36/36]
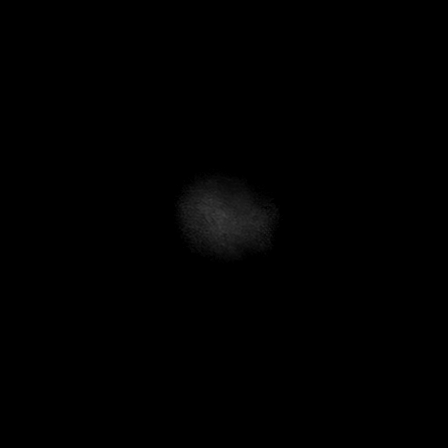

[Series 901: T1 · axial · 5.0mm · 0.48mm/px · 1 of 26 slices shown]
[im 1/26]
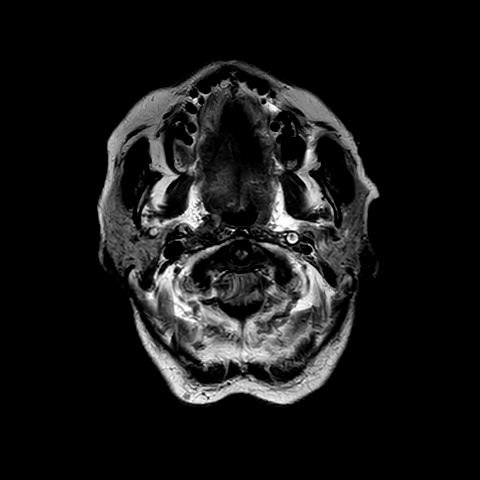

[Series 1102: T1 post-contrast · axial · 1.5mm · 0.75mm/px · z∈[-75,+88]mm · 5 of 110 slices shown (1 of 2)]
[im 1/110]
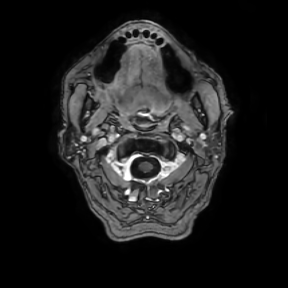
[im 28/110]
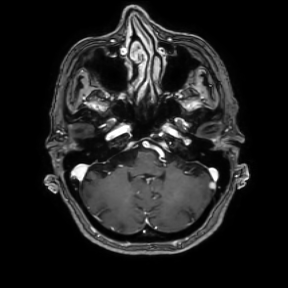
[im 55/110]
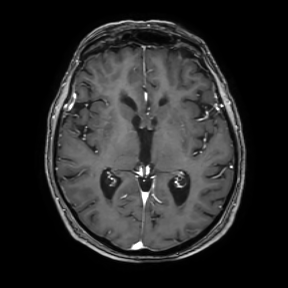
[im 82/110]
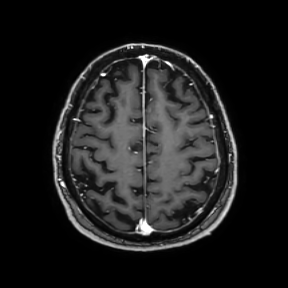
[im 110/110]
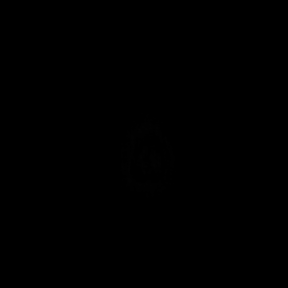

[Series 1103: T1 post-contrast · coronal · 1.5mm · 0.64mm/px · 3 of 75 slices shown (2 of 2)]
[im 1/75]
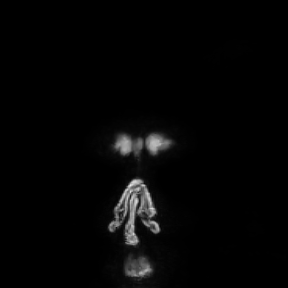
[im 38/75]
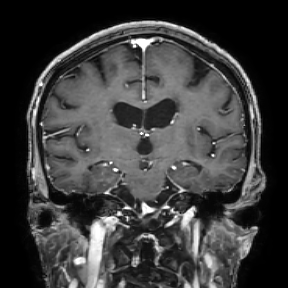
[im 75/75]
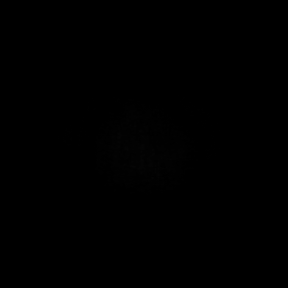

[Series 1201: T1 fat-sat post-contrast · axial · 5.0mm · 0.48mm/px · 1 of 26 slices shown]
[im 1/26]
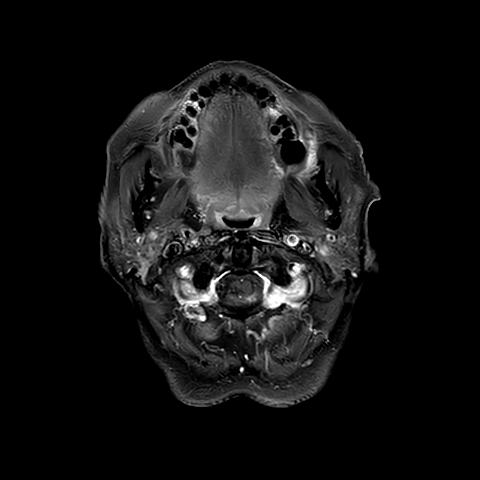

[39 of 48 positions shown; findings below may reference images not displayed]

FINDINGS: Again evident is an anterior transfalcine meningioma, with bilobed morphology, 
indenting the paramedian right and left frontal lobes. Coronal measurement
cm, AP dimension 1.8 cm, maximum vertical height on the right 1.8 cm, on the 
left 1.4 cm. This is unchanged. There is no surrounding edema. 
There is a 7 x 4 mm meningioma just below the sigmoid sinus, arising along the 
lateral left cerebellum. There is minimal mass effect. No edema. This is 
unchanged. 
No other intracranial mass. Moderate cerebral white matter microangiopathic 
changes. Mild pontine microangiopathy. Mild to moderate cortical atrophy. No 
hydrocephalus. Craniocervical junction is open. No sellar mass. 
Diffusion images are negative. Mild hemosiderin staining at the periphery of the 
meningioma. Punctate hemosiderin in the right parietal subcortical white matter. 
Paranasal sinuses and otomastoid spaces are clear. Bilateral ocular lens 
implants.
IMPRESSION: Stable 2.6 cm anterior transfalcine meningioma. No surrounding edema. 
7 mm meningioma arising along the lateral left cerebellum margin, just below and 
posterior to the sigmoid sinus, also unchanged. 
No extra-axial collection. No evidence for recent ischemic change. Moderate 
chronic white matter microangiopathy.
